# Patient Record
Sex: Male | Born: 1995 | Race: Black or African American | Hispanic: No | Marital: Single | Smoking: Current some day smoker
Health system: Southern US, Community
[De-identification: ages and names within clinical notes are randomized; demographics above are authoritative.]

## PROBLEM LIST (undated history)

## (undated) HISTORY — PX: ADENOIDECTOMY: SUR15

---
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
## 2013-01-01 IMAGING — CR DG HAND COMPLETE 3+V*R*
3 series · 3 of 3 positions shown · non-contrast
Comparison: None.

CLINICAL DATA: Pain post blunt trauma

RIGHT HAND - COMPLETE 3+ VIEW

[x hand pa right]
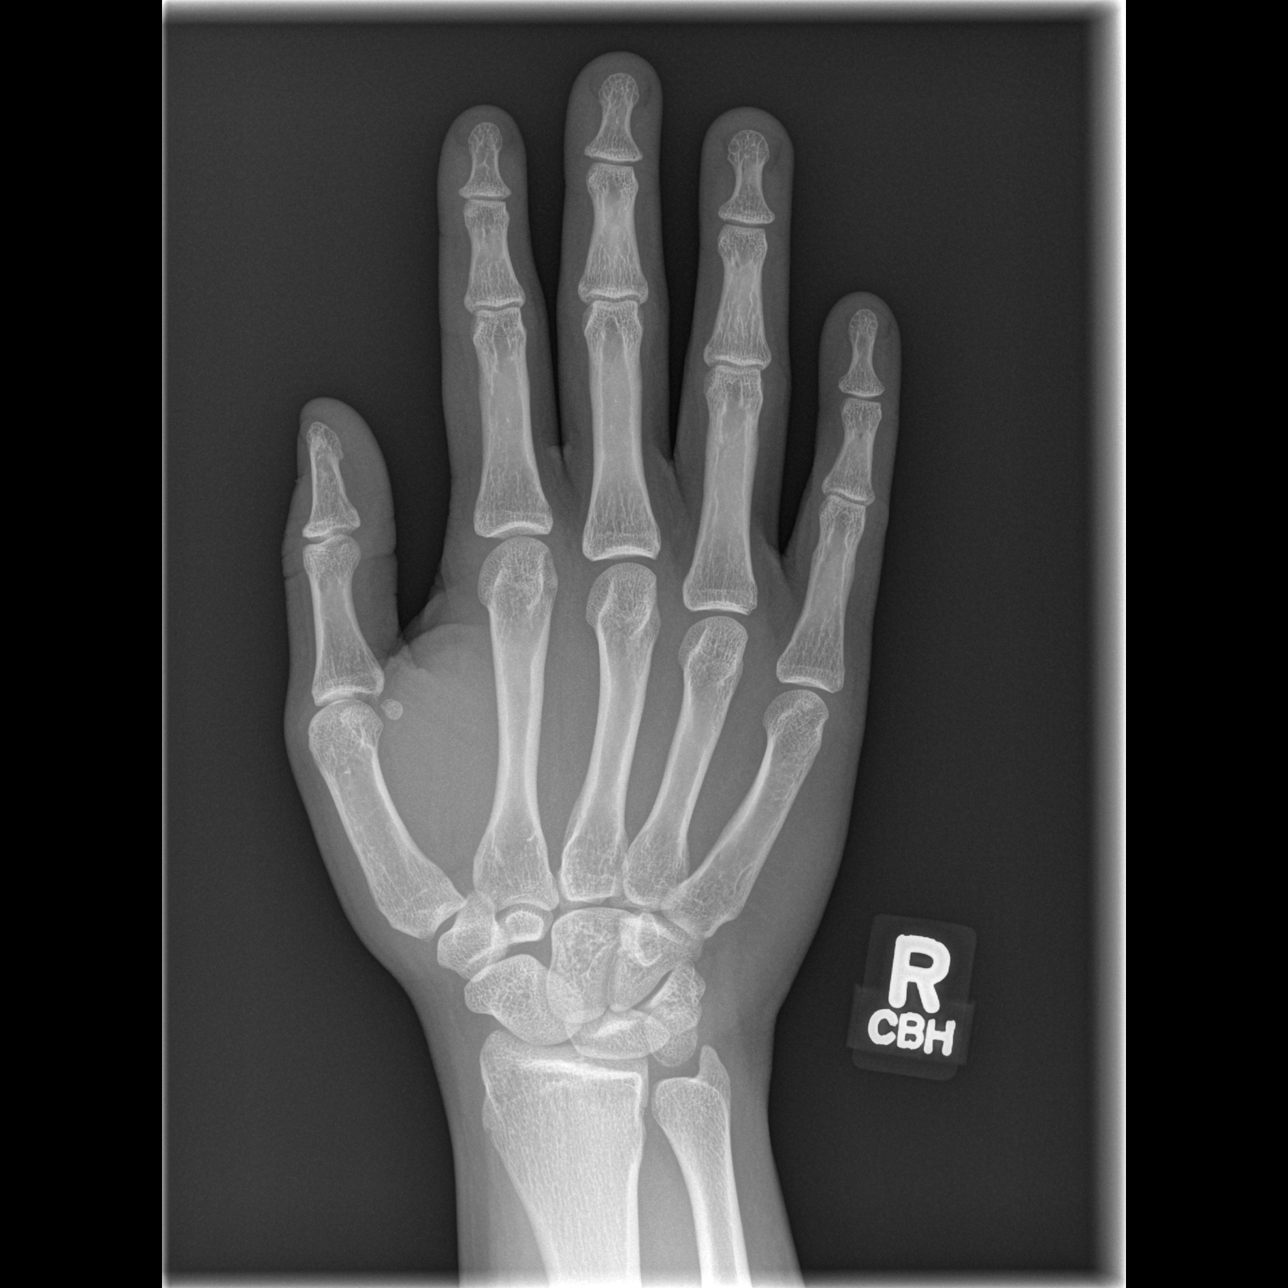

[x hand oblique right]
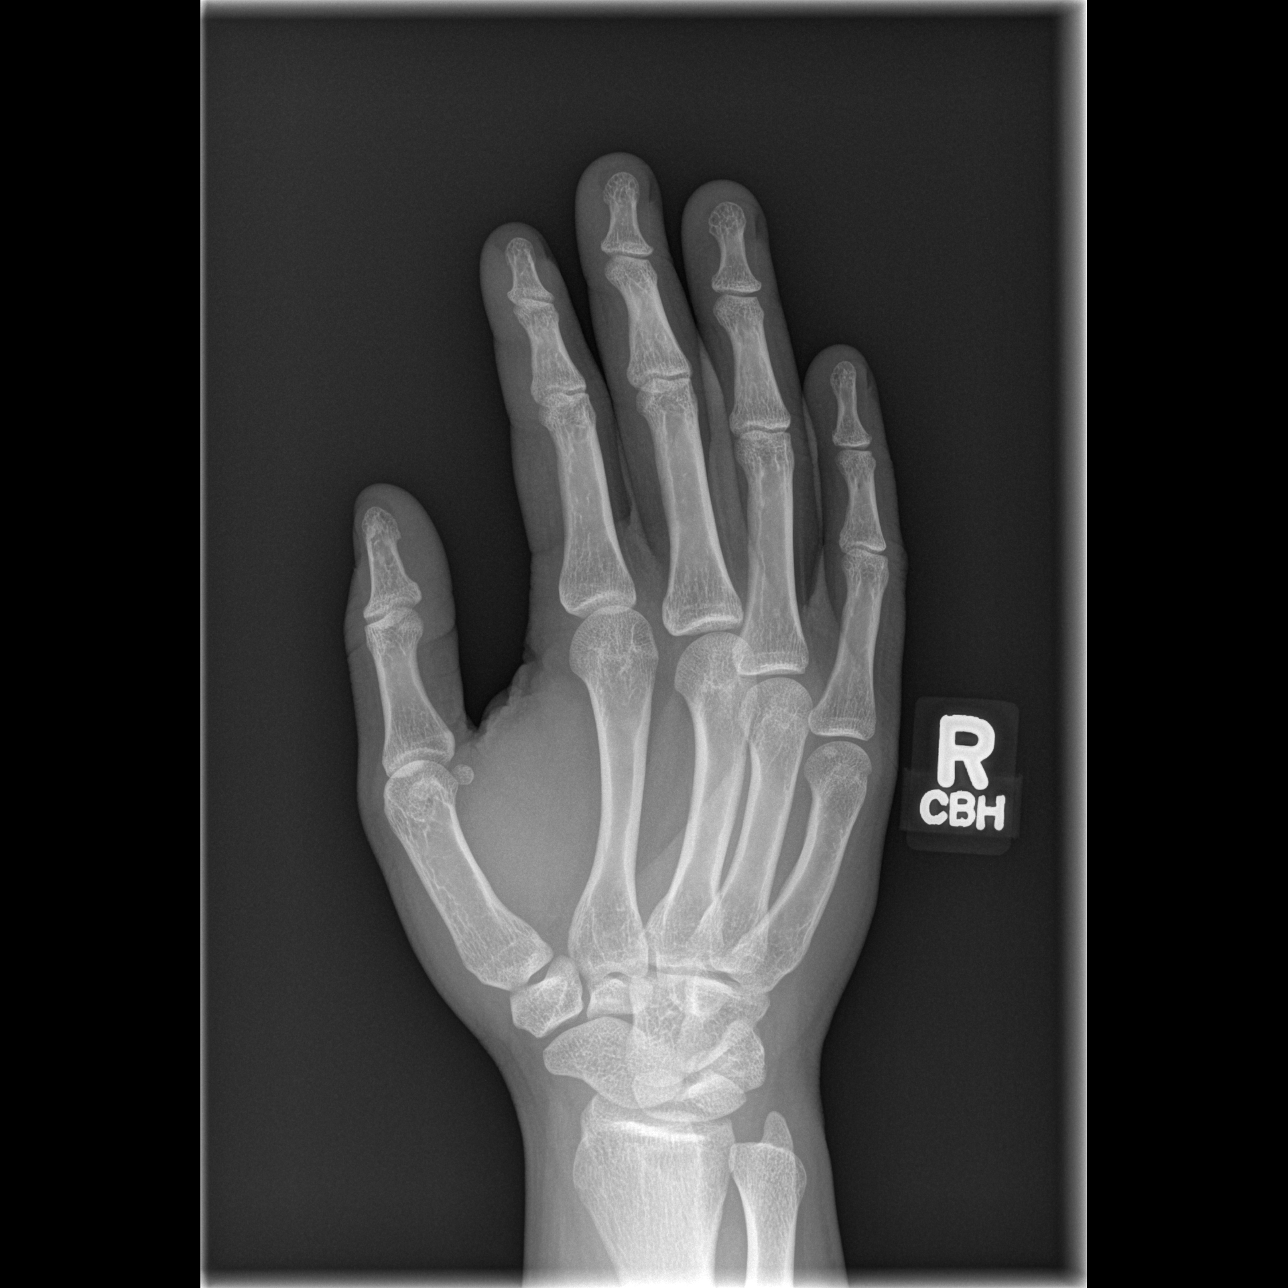

[x hand lat right]
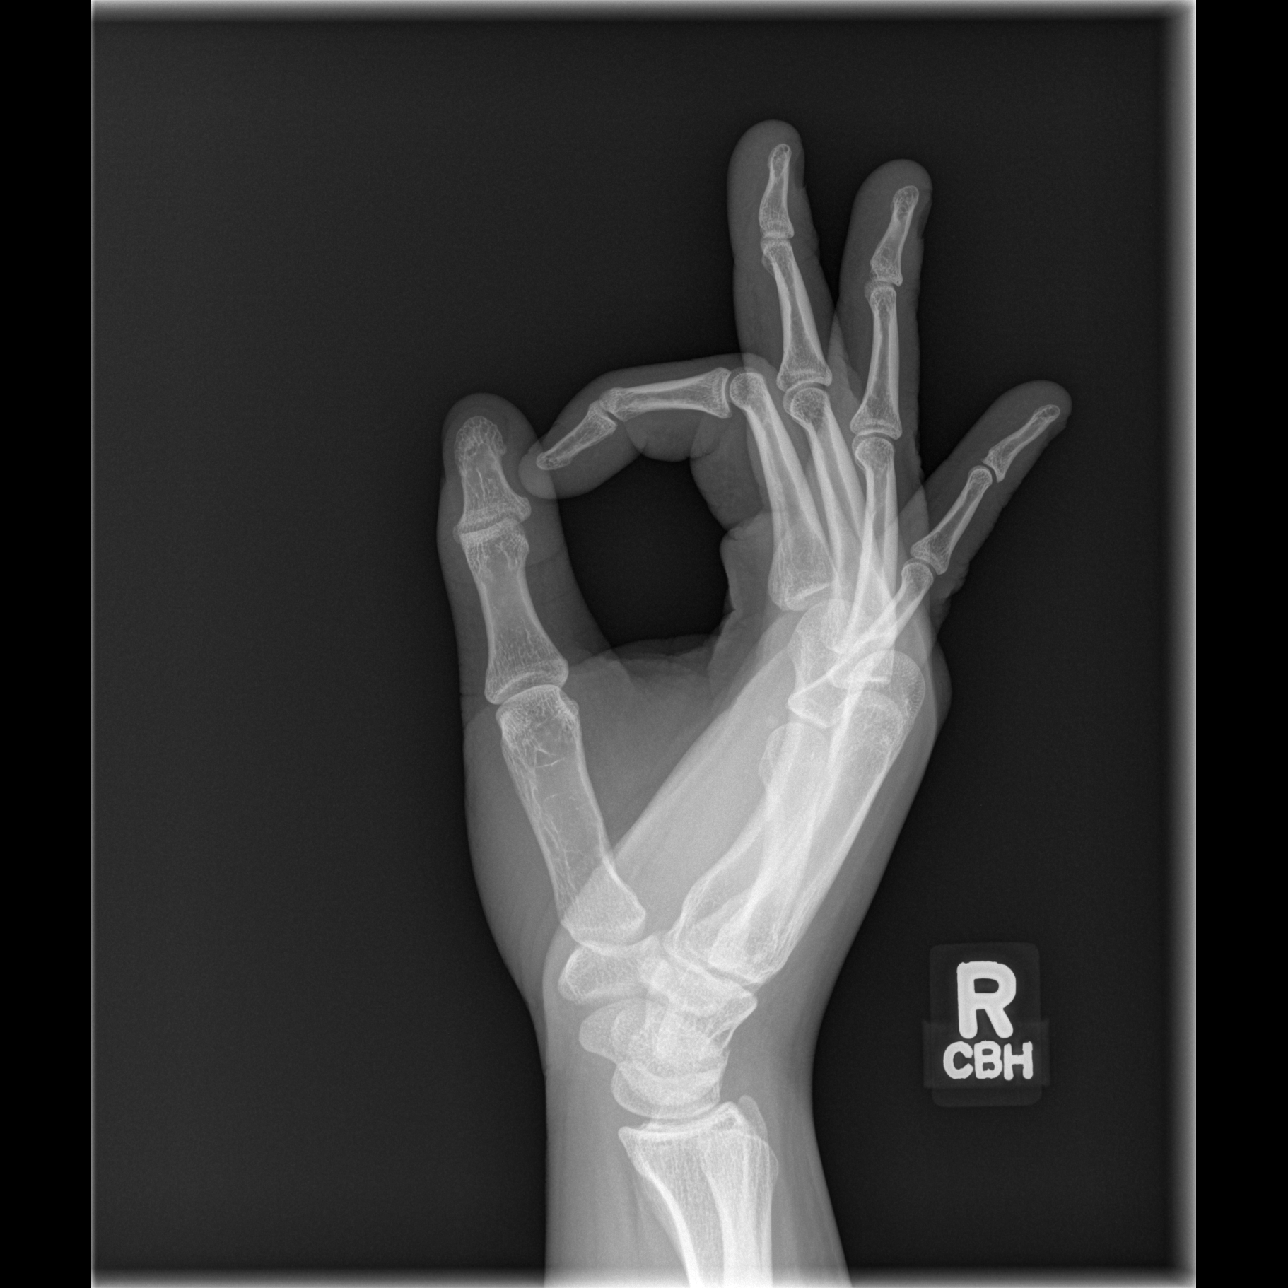

[3 of 3 positions shown; findings below may reference images not displayed]

FINDINGS: Negative for fracture, dislocation, or other acute
abnormality.  Normal alignment and mineralization. No significant
degenerative change.  Regional soft tissues unremarkable.
IMPRESSION: Negative

## 2015-03-27 ENCOUNTER — Encounter (HOSPITAL_COMMUNITY): Payer: Self-pay

## 2015-03-27 ENCOUNTER — Emergency Department (HOSPITAL_COMMUNITY)
Admission: EM | Admit: 2015-03-27 | Discharge: 2015-03-27 | Discharge status: Against Medical Advice | Payer: Medicaid Other | Attending: Emergency Medicine | Admitting: Emergency Medicine

## 2015-03-27 DIAGNOSIS — Z5321 Procedure and treatment not carried out due to patient leaving prior to being seen by health care provider: Secondary | ICD-10-CM

## 2015-03-27 DIAGNOSIS — R51 Headache: Secondary | ICD-10-CM | POA: Insufficient documentation

## 2015-03-27 DIAGNOSIS — Z720 Tobacco use: Secondary | ICD-10-CM | POA: Insufficient documentation

## 2015-03-27 DIAGNOSIS — R200 Anesthesia of skin: Secondary | ICD-10-CM | POA: Insufficient documentation
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
## 2015-03-27 NOTE — ED Notes (Signed)
Patient called for room x3 with no answer 

## 2015-03-27 NOTE — ED Notes (Signed)
No answer when called 

## 2015-03-27 NOTE — ED Provider Notes (Signed)
7:30 PM Patient not in room.  19:50 Patient has been called several times but not found in the waiting room.  Apparently left without being seen.   Felicie Morn, NP 03/27/15 0865  Pricilla Loveless, MD 03/30/15 [phone]

## 2015-12-21 ENCOUNTER — Emergency Department (HOSPITAL_COMMUNITY): Payer: Self-pay

## 2015-12-21 ENCOUNTER — Emergency Department (HOSPITAL_COMMUNITY)
Admission: EM | Admit: 2015-12-21 | Discharge: 2015-12-21 | Disposition: A | Discharge status: Home / Self Care | Payer: Self-pay | Attending: Emergency Medicine | Admitting: Emergency Medicine

## 2015-12-21 ENCOUNTER — Encounter (HOSPITAL_COMMUNITY): Payer: Self-pay | Admitting: Emergency Medicine

## 2015-12-21 DIAGNOSIS — S61011A Laceration without foreign body of right thumb without damage to nail, initial encounter: Secondary | ICD-10-CM | POA: Insufficient documentation

## 2015-12-21 DIAGNOSIS — Y9389 Activity, other specified: Secondary | ICD-10-CM | POA: Insufficient documentation

## 2015-12-21 DIAGNOSIS — Y999 Unspecified external cause status: Secondary | ICD-10-CM | POA: Insufficient documentation

## 2015-12-21 DIAGNOSIS — W25XXXA Contact with sharp glass, initial encounter: Secondary | ICD-10-CM | POA: Insufficient documentation

## 2015-12-21 DIAGNOSIS — S61214A Laceration without foreign body of right ring finger without damage to nail, initial encounter: Secondary | ICD-10-CM | POA: Insufficient documentation

## 2015-12-21 DIAGNOSIS — S61411A Laceration without foreign body of right hand, initial encounter: Secondary | ICD-10-CM

## 2015-12-21 DIAGNOSIS — F17210 Nicotine dependence, cigarettes, uncomplicated: Secondary | ICD-10-CM | POA: Insufficient documentation

## 2015-12-21 DIAGNOSIS — Y929 Unspecified place or not applicable: Secondary | ICD-10-CM | POA: Insufficient documentation
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
## 2015-12-21 NOTE — Discharge Instructions (Signed)
Laceration Care, Adult A laceration is a cut that goes through all of the layers of the skin and into the tissue that is right under the skin. Some lacerations heal on their own. Others need to be closed with stitches (sutures), staples, skin adhesive strips, or skin glue. Proper laceration care minimizes the risk of infection and helps the laceration to heal better. HOW TO CARE FOR YOUR LACERATION  If skin glue was used:  Try to keep the wound dry, but you may briefly wet it in the shower or bath. Do not soak the wound in water, such as by swimming.  After you have showered or bathed, gently pat the wound dry with a clean towel. Do not rub the wound.  Do not do any activities that will make you sweat heavily until the skin glue has fallen off on its own.  Do not apply liquid, cream, or ointment medicine to the wound while the skin glue is in place. Using those may loosen the film before the wound has healed.  If you were given a bandage (dressing), you should change it at least one time per day or as told by your health care provider. You should also change it if it becomes dirty or wet.  If a dressing is placed over the wound, be careful not to apply tape directly over the skin glue. Doing that may cause the glue to be pulled off before the wound has healed.  Do not pick at the glue. The skin glue usually remains in place for 5-10 days, then it falls off of the skin. General Instructions  Take over-the-counter and prescription medicines only as told by your health care provider.  If you were prescribed an antibiotic medicine or ointment, take or apply it as told by your doctor. Do not stop using it even if your condition improves.  To help prevent scarring, make sure to cover your wound with sunscreen whenever you are outside after stitches are removed, after adhesive strips are removed, or when glue remains in place and the wound is healed. Make sure to wear a sunscreen of at least 30  SPF.  Do not scratch or pick at the wound.  Keep all follow-up visits as told by your health care provider. This is important.  Check your wound every day for signs of infection. Watch for:  Redness, swelling, or pain.  Fluid, blood, or pus.  Raise (elevate) the injured area above the level of your heart while you are sitting or lying down, if possible. SEEK MEDICAL CARE IF:  You received a tetanus shot and you have swelling, severe pain, redness, or bleeding at the injection site.  You have a fever.  A wound that was closed breaks open.  You notice a bad smell coming from your wound or your dressing.  You notice something coming out of the wound, such as wood or glass.  Your pain is not controlled with medicine.  You have increased redness, swelling, or pain at the site of your wound.  You have fluid, blood, or pus coming from your wound.  You notice a change in the color of your skin near your wound.  You need to change the dressing frequently due to fluid, blood, or pus draining from the wound.  You develop a new rash.  You develop numbness around the wound. SEEK IMMEDIATE MEDICAL CARE IF:  You develop severe swelling around the wound.  Your pain suddenly increases and is severe.  You develop  painful lumps near the wound or on skin that is anywhere on your body.  You have a red streak going away from your wound.  The wound is on your hand or foot and you cannot properly move a finger or toe.  The wound is on your hand or foot and you notice that your fingers or toes look pale or bluish.   This information is not intended to replace advice given to you by your health care provider. Make sure you discuss any questions you have with your health care provider.   Document Released: 06/23/2005 Document Revised: 11/07/2014 Document Reviewed: 06/19/2014 [employer] Interactive Patient Education [employer]
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
